# Patient Record
Sex: Male | Born: 1995 | Race: White | Hispanic: No | Marital: Single | Smoking: Current some day smoker
Health system: Southern US, Community
[De-identification: ages and names within clinical notes are randomized; demographics above are authoritative.]

## PROBLEM LIST (undated history)

## (undated) DIAGNOSIS — R635 Abnormal weight gain: Secondary | ICD-10-CM

## (undated) DIAGNOSIS — T50905A Adverse effect of unspecified drugs, medicaments and biological substances, initial encounter: Secondary | ICD-10-CM

## (undated) DIAGNOSIS — E669 Obesity, unspecified: Secondary | ICD-10-CM

## (undated) DIAGNOSIS — Z7289 Other problems related to lifestyle: Secondary | ICD-10-CM

## (undated) DIAGNOSIS — J310 Chronic rhinitis: Secondary | ICD-10-CM

## (undated) DIAGNOSIS — F909 Attention-deficit hyperactivity disorder, unspecified type: Secondary | ICD-10-CM

## (undated) DIAGNOSIS — F329 Major depressive disorder, single episode, unspecified: Secondary | ICD-10-CM

## (undated) DIAGNOSIS — F32A Depression, unspecified: Secondary | ICD-10-CM

## (undated) DIAGNOSIS — T7840XA Allergy, unspecified, initial encounter: Secondary | ICD-10-CM

## (undated) DIAGNOSIS — F419 Anxiety disorder, unspecified: Secondary | ICD-10-CM

## (undated) HISTORY — DX: Chronic rhinitis: J31.0

## (undated) HISTORY — DX: Allergy, unspecified, initial encounter: T78.40XA

## (undated) HISTORY — DX: Adverse effect of unspecified drugs, medicaments and biological substances, initial encounter: T50.905A

## (undated) HISTORY — PX: NO PAST SURGERIES: SHX2092

## (undated) HISTORY — DX: Obesity, unspecified: E66.9

## (undated) HISTORY — DX: Attention-deficit hyperactivity disorder, unspecified type: F90.9

## (undated) HISTORY — DX: Abnormal weight gain: R63.5

## (undated) HISTORY — DX: Other problems related to lifestyle: Z72.89

---
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
## 2017-01-01 DIAGNOSIS — R739 Hyperglycemia, unspecified: Secondary | ICD-10-CM | POA: Diagnosis not present

## 2017-01-05 DIAGNOSIS — F5104 Psychophysiologic insomnia: Secondary | ICD-10-CM | POA: Diagnosis not present

## 2017-01-05 DIAGNOSIS — F329 Major depressive disorder, single episode, unspecified: Secondary | ICD-10-CM | POA: Diagnosis not present

## 2017-01-05 DIAGNOSIS — R400 Somnolence: Secondary | ICD-10-CM | POA: Diagnosis not present

## 2017-01-05 DIAGNOSIS — F411 Generalized anxiety disorder: Secondary | ICD-10-CM | POA: Diagnosis not present

## 2017-01-09 DIAGNOSIS — F5104 Psychophysiologic insomnia: Secondary | ICD-10-CM | POA: Diagnosis not present

## 2017-01-09 DIAGNOSIS — G4719 Other hypersomnia: Secondary | ICD-10-CM | POA: Diagnosis not present

## 2017-09-03 ENCOUNTER — Telehealth: Payer: Self-pay

## 2017-09-03 NOTE — Telephone Encounter (Signed)
FYI

## 2017-09-03 NOTE — Telephone Encounter (Signed)
Copied from CRM [id number]. Topic: Appointment Scheduling - New Patient >> Sep 02, 2017  1:55 PM Arlyss Gandyichardson, Taren N, NT wrote: New patient has been scheduled for your office. Provider: Dr. Carmelia RollerWendling Date of Appointment: 09/16/17  Route to department's PEC pool.

## 2017-09-16 ENCOUNTER — Encounter: Payer: Self-pay | Admitting: Family Medicine

## 2017-09-16 ENCOUNTER — Ambulatory Visit: Payer: Self-pay | Admitting: Family Medicine

## 2017-09-16 ENCOUNTER — Ambulatory Visit (INDEPENDENT_AMBULATORY_CARE_PROVIDER_SITE_OTHER): Payer: BLUE CROSS/BLUE SHIELD | Admitting: Family Medicine

## 2017-09-16 VITALS — BP 118/78 | HR 80 | Temp 98.3°F | Ht 68.0 in | Wt 188.0 lb

## 2017-09-16 DIAGNOSIS — F902 Attention-deficit hyperactivity disorder, combined type: Secondary | ICD-10-CM

## 2017-09-16 DIAGNOSIS — F418 Other specified anxiety disorders: Secondary | ICD-10-CM | POA: Diagnosis not present

## 2017-09-16 MED ORDER — FLUOXETINE HCL 20 MG PO TABS: 20.0000 mg | ORAL_TABLET | Freq: Every day | ORAL | 3 refills | Status: DC

## 2017-09-16 MED ORDER — ATOMOXETINE HCL 40 MG PO CAPS: 40.0000 mg | ORAL_CAPSULE | Freq: Every day | ORAL | 2 refills | Status: DC

## 2017-09-16 NOTE — Progress Notes (Signed)
Pre visit review using our clinic review tool, if applicable. No additional management support is needed unless otherwise documented below in the visit note. 

## 2017-09-16 NOTE — Progress Notes (Signed)
Chief Complaint  Patient presents with  . New Patient (Initial Visit)       New Patient Visit SUBJECTIVE: HPI: Nathaniel OpitzMichael R Van Herpen is an 22 y.o.male who is being seen for establishing care.  Hx of ADHD, dx'd with he was 8 by pediatrician.  He has been off of his medication for the past 3 years.  He has been having issues with anxiety, depression, and tension.  Patient wishes to go back on medication.  His most recent successful medication with Strattera.  He does not remember what he used to be on for anxiety/depression.  He does have a family history of mental illness with grandmother and father.  No homicidal or suicidal ideations.  Due to his inattention, he has been having difficulties maintaining steady work.  He does not currently follow with a counselor or psychologist.  No self-medication.  Allergies  Allergen Reactions  . Cinnamon Other (See Comments)    Throat itching    Past Medical History:  Diagnosis Date  . ADHD (attention deficit hyperactivity disorder) 04/14/2011  . Allergy   . Chronic rhinitis    acute sinusitis about once a year  . Depression   . Obesity    Wt going up on psych meds  . 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 Weight gain due to medication 05/22/2011   History reviewed. No pertinent surgical history. Family History  Problem Relation Age of Onset  . Hypertension Mother   . Anemia Mother   . Iron deficiency Mother   . Diabetes Father   . ADD / ADHD Brother    Allergies  Allergen Reactions  . Cinnamon Other (See Comments)    Throat itching   Came into appointment taking no medications routinely  ROS Cardiovascular: Denies palpitations  Psych: As noted in HPI   OBJECTIVE: BP 118/78 (BP Location: Left Arm, Patient Position: Sitting, Cuff Size: Normal)   Pulse 80   Temp 98.3 F (36.8 C) (Oral)   Ht 5\' 8"  (1.727 m)   Wt 188 lb (85.3 kg)   SpO2 96%   BMI 28.59 kg/m   Constitutional: -  VS reviewed -   Well developed, well nourished, appears stated age -  No apparent distress  Psychiatric: -  Oriented to person, place, and time -  Memory intact -  Affect and mood normal -  Fluent conversation, good eye contact -  Judgment and insight age appropriate  Eye: -  Conjunctivae clear, no discharge -  Pupils symmetric, round, reactive to light  ENMT: -  MMM    Pharynx moist, no exudate, no erythema  Neck: -  No gross swelling, no palpable masses -  Thyroid midline, not enlarged, mobile, no palpable masses  Cardiovascular: -  RRR -  No LE edema  Respiratory: -  Normal respiratory effort, no accessory muscle use, no retraction -  Breath sounds equal, no wheezes, no ronchi, no crackles  Neurological:  -  CN II - XII grossly intact -DTRs equal and symmetric, no clonus  Skin: -  No significant lesion on inspection -  Warm and dry to palpation   ASSESSMENT/PLAN: Anxiety with depression - Plan: FLUoxetine (PROZAC) 20 MG tablet  Attention deficit hyperactivity disorder (ADHD), combined type - Plan: atomoxetine (STRATTERA) 40 MG capsule  Patient instructed to sign release of records form from his previous PCP. Restart Strattera, start Prozac, half tab daily for 2 weeks. Number for counseling given. Patient should return in 6  weeks. The patient voiced understanding and agreement to the plan.   Jilda Roche [place], DO 09/16/17  12:03 PM

## 2017-09-16 NOTE — Patient Instructions (Signed)
Please consider counseling. Contact [phone] to schedule an appointment or inquire about cost/insurance coverage.  Coping skills Choose 5 that work for you:  Take a deep breath  Count to 20  Read a book  Do a puzzle  Meditate  Bake  Sing  Knit  Garden  Pray  Go outside  Call a friend  Listen to music  Take a walk  Color  Send a note  Take a bath  Watch a movie  Be alone in a quiet place  Pet an animal  Visit a friend  Journal  Exercise  Stretch   Let us know if you need anything.  

## 2017-09-28 ENCOUNTER — Telehealth: Payer: Self-pay | Admitting: Family Medicine

## 2017-09-28 DIAGNOSIS — F418 Other specified anxiety disorders: Secondary | ICD-10-CM

## 2017-09-28 NOTE — Telephone Encounter (Signed)
Copied from CRM [id number]. Topic: General - Other >> Sep 28, 2017  1:31 PM Mcneil, Ja-Kwan wrote: Reason for CRM: Pt states he would need an alternative for both prescriptions because the cost was too expensive. Pt requests a call back. Cb# [phone]

## 2017-09-28 NOTE — Telephone Encounter (Signed)
Called left message to call back 

## 2017-09-28 NOTE — Telephone Encounter (Signed)
Please see if Prozac capsules are covered as well as generic atomoxetine. TY.

## 2017-09-28 NOTE — Telephone Encounter (Signed)
Copied from CRM [phone]. Topic: Quick Communication - Office Called Patient >> Sep 28, 2017  5:38 PM Stephannie LiSimmons, Decoda Van L, NT wrote: Reason for CRM: Patient returned call from office would like a call back

## 2017-09-29 NOTE — Telephone Encounter (Signed)
[facility] Any word on the Atomoxetine? [facility] is an alternative to that and [facility] TY.

## 2017-09-29 NOTE — Telephone Encounter (Signed)
Spoke to the patient and he is ok to send both into the [facility] [place]  prozac capsules

## 2017-09-29 NOTE — Telephone Encounter (Signed)
Called left message to call back 

## 2017-09-29 NOTE — Telephone Encounter (Signed)
The prozac capsules are not covered. Please advise.

## 2017-09-29 NOTE — Telephone Encounter (Signed)
See telephone note already created.

## 2017-09-30 MED ORDER — BUPROPION HCL ER (XL) 150 MG PO TB24: 150.0000 mg | ORAL_TABLET | Freq: Every day | ORAL | 2 refills | Status: DC

## 2017-09-30 MED ORDER — FLUOXETINE HCL 20 MG PO CAPS: 20.0000 mg | ORAL_CAPSULE | Freq: Every day | ORAL | 3 refills | Status: DC

## 2017-09-30 NOTE — Addendum Note (Signed)
Addended by: Radene GunningWENDLING, NICHOLAS P on: 09/30/2017 08:40 AM   Modules accepted: Orders

## 2017-10-28 ENCOUNTER — Encounter: Payer: Self-pay | Admitting: Family Medicine

## 2017-10-28 ENCOUNTER — Ambulatory Visit (INDEPENDENT_AMBULATORY_CARE_PROVIDER_SITE_OTHER): Payer: BLUE CROSS/BLUE SHIELD | Admitting: Family Medicine

## 2017-10-28 VITALS — BP 132/84 | HR 81 | Temp 98.2°F | Ht 68.0 in | Wt 192.5 lb

## 2017-10-28 DIAGNOSIS — F418 Other specified anxiety disorders: Secondary | ICD-10-CM | POA: Diagnosis not present

## 2017-10-28 DIAGNOSIS — E663 Overweight: Secondary | ICD-10-CM

## 2017-10-28 NOTE — Patient Instructions (Signed)
Go back on Flonase.  Try taking your meds in the morning.   Aim to do some physical exertion for 150 minutes per week. This is typically divided into 5 days per week, 30 minutes per day. The activity should be enough to get your heart rate up. Anything is better than nothing if you have time constraints.  Let us know if you need anything.  Healthy Eating Plan Many factors influence your heart health, including eating and exercise habits. Heart (coronary) risk increases with abnormal blood fat (lipid) levels. Heart-healthy meal planning includes limiting unhealthy fats, increasing healthy fats, and making other small dietary changes. This includes maintaining a healthy body weight to help keep lipid levels within a normal range.  WHAT IS MY PLAN?  Your health care provider recommends that you:  Drink a glass of water before meals to help with satiety.  Eat slowly.  An alternative to the water is to add Metamucil. This will help with satiety as well. It does contain calories, unlike water.  WHAT TYPES OF FAT SHOULD I CHOOSE?  Choose healthy fats more often. Choose monounsaturated and polyunsaturated fats, such as olive oil and canola oil, flaxseeds, walnuts, almonds, and seeds.  Eat more omega-3 fats. Good choices include salmon, mackerel, sardines, tuna, flaxseed oil, and ground flaxseeds. Aim to eat fish at least two times each week.  Avoid foods with partially hydrogenated oils in them. These contain trans fats. Examples of foods that contain trans fats are stick margarine, some tub margarines, cookies, crackers, and other baked goods. If you are going to avoid a fat, this is the one to avoid!  WHAT GENERAL GUIDELINES DO I NEED TO FOLLOW?  Check food labels carefully to identify foods with trans fats. Avoid these types of options when possible.  Fill one half of your plate with vegetables and green salads. Eat 4-5 servings of vegetables per day. A serving of vegetables equals 1 cup  of raw leafy vegetables,  cup of raw or cooked cut-up vegetables, or  cup of vegetable juice.  Fill one fourth of your plate with whole grains. Look for the word "whole" as the first word in the ingredient list.  Fill one fourth of your plate with lean protein foods.  Eat 4-5 servings of fruit per day. A serving of fruit equals one medium whole fruit,  cup of dried fruit,  cup of fresh, frozen, or canned fruit. Try to avoid fruits in cups/syrups as the sugar content can be high.  Eat more foods that contain soluble fiber. Examples of foods that contain this type of fiber are apples, broccoli, carrots, beans, peas, and barley. Aim to get 20-30 g of fiber per day.  Eat more home-cooked food and less restaurant, buffet, and fast food.  Limit or avoid alcohol.  Limit foods that are high in starch and sugar.  Avoid fried foods when able.  Cook foods by using methods other than frying. Baking, boiling, grilling, and broiling are all great options. Other fat-reducing suggestions include: ? Removing the skin from poultry. ? Removing all visible fats from meats. ? Skimming the fat off of stews, soups, and gravies before serving them. ? Steaming vegetables in water or broth.  Lose weight if you are overweight. Losing just 5-10% of your initial body weight can help your overall health and prevent diseases such as diabetes and heart disease.  Increase your consumption of nuts, legumes, and seeds to 4-5 servings per week. One serving of dried beans or legumes  equals  cup after being cooked, one serving of nuts equals 1 ounces, and one serving of seeds equals  ounce or 1 tablespoon.  WHAT ARE GOOD FOODS CAN I EAT? Grains Grainy breads (try to find bread that is 3 g of fiber per slice or greater), oatmeal, light popcorn. Whole-grain cereals. Rice and pasta, including brown rice and those that are made with whole wheat. Edamame pasta is a great alternative to grain pasta. It has a higher protein  content. Try to avoid significant consumption of white bread, sugary cereals, or pastries/baked goods.  Vegetables All vegetables. Cooked white potatoes do not count as vegetables.  Fruits All fruits, but limit pineapple and bananas as these fruits have a higher sugar content.  Meats and Other Protein Sources Lean, well-trimmed beef, veal, pork, and lamb. Chicken and [place] without skin. All fish and shellfish. Wild duck, rabbit, pheasant, and venison. Egg whites or low-cholesterol egg substitutes. Dried beans, peas, lentils, and tofu.Seeds and most nuts.  Dairy Low-fat or nonfat cheeses, including ricotta, string, and mozzarella. Skim or 1% milk that is liquid, powdered, or evaporated. Buttermilk that is made with low-fat milk. Nonfat or low-fat yogurt. Soy/Almond milk are good alternatives if you cannot handle dairy.  Beverages Water is the best for you. Sports drinks with less sugar are more desirable unless you are a highly active athlete.  Sweets and Desserts Sherbets and fruit ices. Honey, jam, marmalade, jelly, and syrups. Dark chocolate.  Eat all sweets and desserts in moderation.  Fats and Oils Nonhydrogenated (trans-free) margarines. Vegetable oils, including soybean, sesame, sunflower, olive, peanut, safflower, corn, canola, and cottonseed. Salad dressings or mayonnaise that are made with a vegetable oil. Limit added fats and oils that you use for cooking, baking, salads, and as spreads.  Other Cocoa powder. Coffee and tea. Most condiments.  The items listed above may not be a complete list of recommended foods or beverages. Contact your dietitian for more options.

## 2017-10-28 NOTE — Progress Notes (Signed)
Pre visit review using our clinic review tool, if applicable. No additional management support is needed unless otherwise documented below in the visit note. 

## 2017-10-28 NOTE — Progress Notes (Signed)
Chief Complaint  Patient presents with  . Follow-up    Subjective Nathaniel Jacobson presents for f/u anxiety/depression.  He is currently being treated with Prozac and Wellbutrin.  Reports improvement since treatment. No thoughts of harming self or others. No self-medication with alcohol, prescription drugs or illicit drugs. Not following with counselor or psychologist.  Has been gaining weight.  Diet is fair.  He is active at work.  ROS Psych: No homicidal or suicidal thoughts  Past Medical History:  Diagnosis Date  . ADHD (attention deficit hyperactivity disorder) 04/14/2011  . Allergy   . Chronic rhinitis    acute sinusitis about once a year  . Depression   . Obesity    Wt going up on psych meds  . 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 Weight gain due to medication 05/22/2011   Exam BP 132/84 (BP Location: Left Arm, Patient Position: Sitting, Cuff Size: Normal)   Pulse 81   Temp 98.2 F (36.8 C) (Oral)   Ht 5\' 8"  (1.727 m)   Wt 192 lb 8 oz (87.3 kg)   SpO2 96%   BMI 29.27 kg/m  General:  well developed, well nourished, in no apparent distress Neck: neck supple without adenopathy, thyromegaly, or masses Lungs:  clear to auscultation, breath sounds equal bilaterally, no respiratory distress Cardio:  regular rate and rhythm without murmurs, heart sounds without clicks or rubs Psych: well oriented with normal range of affect and age-appropriate judgement/insight, alert and oriented x4.  Assessment and Plan  Anxiety with depression  Overweight (BMI 25.0-29.9)  Continue Prozac and Wellbutrin. Try to take medicine before work. Counseled on diet and exercise.  Healthy diet handout given. F/u in 3 mo. The patient voiced understanding and agreement to the plan.  Jilda Rocheicholas Paul RudyardWendling, DO 10/28/17 10:58 AM

## 2018-01-28 ENCOUNTER — Ambulatory Visit: Payer: BLUE CROSS/BLUE SHIELD | Admitting: Family Medicine

## 2018-01-28 DIAGNOSIS — Z0289 Encounter for other administrative examinations: Secondary | ICD-10-CM

## 2018-02-04 ENCOUNTER — Encounter: Payer: Self-pay | Admitting: Family Medicine

## 2018-07-15 ENCOUNTER — Other Ambulatory Visit: Payer: Self-pay

## 2018-07-15 ENCOUNTER — Ambulatory Visit (INDEPENDENT_AMBULATORY_CARE_PROVIDER_SITE_OTHER): Payer: BLUE CROSS/BLUE SHIELD | Admitting: Podiatry

## 2018-07-15 ENCOUNTER — Encounter: Payer: Self-pay | Admitting: Podiatry

## 2018-07-15 VITALS — Temp 97.5°F

## 2018-07-15 DIAGNOSIS — L600 Ingrowing nail: Secondary | ICD-10-CM

## 2018-07-15 DIAGNOSIS — M79676 Pain in unspecified toe(s): Secondary | ICD-10-CM | POA: Diagnosis not present

## 2018-07-15 MED ORDER — NEOMYCIN-POLYMYXIN-HC 3.5-10000-1 OT SOLN: OTIC | 0 refills | Status: DC

## 2018-07-15 NOTE — Patient Instructions (Signed)
Epsom Salt Soak Instructions    IF SOAKING IS STILL NECESSARY, START THIS 2 WEEKS AFTER INITIAL PROCEDURE   Place 1/4 cup of epsom salts in a quart of warm tap water.  Soak your foot or feet in the solution for 20 minutes twice a day until you notice the area has dried and a scab has formed. Continue to apply other medications to the area as directed by the doctor such as polysporin, neosporin or cortisporin drops.  IF YOUR SKIN BECOMES IRRITATED WHILE USING THESE INSTRUCTIONS, IT IS OKAY TO SWITCH TO  WHITE VINEGAR AND WATER. Or you may use antibacterial soap and water to keep the toe clean  Monitor for any signs/symptoms of infection. Call the office immediately if any occur or go directly to the emergency room. Call with any questions/concerns.  Long Term Care Instructions-Post Nail Surgery  You have had your ingrown toenail and root treated with a chemical.  This chemical causes a burn that will drain and ooze like a blister.  This can drain for 6-8 weeks or longer.  It is important to keep this area clean, covered, and follow the soaking instructions dispensed at the time of your surgery.  This area will eventually dry and form a scab.  Once the scab forms you no longer need to soak or apply a dressing.  If at any time you experience an increase in pain, redness, swelling, or drainage, you should contact the office as soon as possible. 

## 2018-07-15 NOTE — Progress Notes (Signed)
Subjective:  Patient ID: Nathaniel Jacobson, male    DOB: 09/15/1995,  MRN: [id number]  Chief Complaint  Patient presents with  . Ingrown Toenail    Rt hallux nail" my toenail has been hurting for awhile, I have tried soaking it but it still hurts"    23 y.o. male presents with the above complaint. History as above. Says that the tunnel is hurting for a while has a history of ingrown toenails to both feet.  Review of Systems: Negative except as noted in the HPI. Denies N/V/F/Ch.  Past Medical History:  Diagnosis Date  . ADHD (attention deficit hyperactivity disorder) 04/14/2011  . Allergy   . Chronic rhinitis    acute sinusitis about once a year  . Depression   . Obesity    Wt going up on psych meds  . 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 Weight gain due to medication 05/22/2011    Current Outpatient Medications:  .  traZODone (DESYREL) 50 MG tablet, Take 1-2 tablets nightly at bedtime., Disp: , Rfl:  .  buPROPion (WELLBUTRIN XL) 150 MG 24 hr tablet, Take 1 tablet (150 mg total) by mouth daily., Disp: 30 tablet, Rfl: 2 .  FLUoxetine (PROZAC) 20 MG capsule, Take 1 capsule (20 mg total) by mouth daily., Disp: 30 capsule, Rfl: 3 .  neomycin-polymyxin-hydrocortisone (CORTISPORIN) OTIC solution, Apply 2 drops to the ingrown toenail site twice daily. Cover with band-aid., Disp: 10 mL, Rfl: 0  Social History   Tobacco Use  Smoking Status Never Smoker  Smokeless Tobacco Never Used    Allergies  Allergen Reactions  . Cinnamon Other (See Comments)    Throat itching   Objective:  Vitals:   07/15/18 1421  Temp: (!) 97.5 F (36.4 C)   There is no height or weight on file to calculate BMI. Constitutional Well developed. Well nourished.  Vascular Dorsalis pedis pulses palpable bilaterally. Posterior tibial pulses palpable bilaterally. Capillary refill normal to all digits.  No cyanosis or clubbing noted. Pedal hair growth normal.  Neurologic  Normal speech. Oriented to person, place, and time. Epicritic sensation to light touch grossly present bilaterally.  Dermatologic Painful ingrowing nail at lateral nail borders of the hallux nail right. No other open wounds. No skin lesions.  Orthopedic: Normal joint ROM without pain or crepitus bilaterally. No visible deformities. No bony tenderness.   Radiographs: None Assessment:   1. Ingrown nail   2. Pain around toenail    Plan:  Patient was evaluated and treated and all questions answered.  Ingrown Nail, right -Patient elects to proceed with minor surgery to remove ingrown toenail removal today. Consent reviewed and signed by patient. -Ingrown nail excised. See procedure note. -Educated on post-procedure care including soaking. Written instructions provided and reviewed. -Patient to follow up in 2 weeks for nail check.  Procedure: Excision of Ingrown Toenail Location: Right 1st toe lateral nail borders. Anesthesia: Lidocaine 1% plain; 1.5 mL and Marcaine 0.5% plain; 1.5 mL, digital block. Skin Prep: Betadine. Dressing: Silvadene; telfa; dry, sterile, compression dressing. Technique: Following skin prep, the toe was exsanguinated and a tourniquet was secured at the base of the toe. The affected nail border was freed, split with a nail splitter, and excised. Chemical matrixectomy was then performed with phenol and irrigated out with alcohol. The tourniquet was then removed and sterile dressing applied. Disposition: Patient tolerated procedure well. Patient to return in 2 weeks for follow-up.   Return in about 2 weeks (  around 07/29/2018) for Nail Check.

## 2018-07-29 ENCOUNTER — Encounter: Payer: Self-pay | Admitting: Podiatry

## 2018-07-29 ENCOUNTER — Ambulatory Visit (INDEPENDENT_AMBULATORY_CARE_PROVIDER_SITE_OTHER): Payer: BLUE CROSS/BLUE SHIELD | Admitting: Podiatry

## 2018-07-29 ENCOUNTER — Other Ambulatory Visit: Payer: Self-pay

## 2018-07-29 VITALS — Temp 98.4°F

## 2018-07-29 DIAGNOSIS — M7989 Other specified soft tissue disorders: Secondary | ICD-10-CM

## 2018-07-29 DIAGNOSIS — M79674 Pain in right toe(s): Secondary | ICD-10-CM | POA: Diagnosis not present

## 2018-07-29 DIAGNOSIS — M79676 Pain in unspecified toe(s): Secondary | ICD-10-CM

## 2018-07-29 DIAGNOSIS — L600 Ingrowing nail: Secondary | ICD-10-CM

## 2018-07-29 NOTE — Progress Notes (Signed)
  Subjective:  Patient ID: Nathaniel Jacobson, male    DOB: January 03, 1996,  MRN: [id number]  Chief Complaint  Patient presents with  . Nail Problem    R-great toe ingrown follow up; "doing alright; still red with little scabbing; concerned about other big toe now on L; starting to bother me more on medial side"   23 y.o. male returns for the above complaint.  History above confirmed with patient  Objective:   General AA&O x3. Normal mood and affect.  Vascular Foot warm and well perfused with good capillary refill.  Neurologic Sensation grossly intact.  Dermatologic Right lateral border healing well with erythema but no warmth no drainage no signs of acute infection Painful ingrown toenail medial border left great toe  Orthopedic: No tenderness to palpation of the toe.  Pain to palpation about the left great toe   Assessment & Plan:  Patient was evaluated and treated and all questions answered.  S/p Ingrown Toenail Excision, right -Healing well without issue. -Discussed return precautions. -F/u PRN  Ingrown toenail left great toe medial border -Debrided in slant back fashion to patient relief

## 2018-08-16 ENCOUNTER — Ambulatory Visit (INDEPENDENT_AMBULATORY_CARE_PROVIDER_SITE_OTHER): Payer: BLUE CROSS/BLUE SHIELD | Admitting: Podiatry

## 2018-08-16 ENCOUNTER — Other Ambulatory Visit: Payer: Self-pay

## 2018-08-16 ENCOUNTER — Encounter: Payer: Self-pay | Admitting: Podiatry

## 2018-08-16 VITALS — Temp 97.3°F

## 2018-08-16 DIAGNOSIS — L600 Ingrowing nail: Secondary | ICD-10-CM | POA: Diagnosis not present

## 2018-08-16 MED ORDER — NEOMYCIN-POLYMYXIN-HC 3.5-10000-1 OT SOLN: OTIC | 0 refills | Status: DC

## 2018-08-16 NOTE — Patient Instructions (Signed)

## 2018-08-18 NOTE — Progress Notes (Signed)
Subjective:   Patient ID: Nathaniel Jacobson, male   DOB: 23 y.o.   MRN: [id number]   HPI Patient presents with painful ingrown toenail deformity left hallux stating that it is hard for him to wear shoe gear comfortably   ROS      Objective:  Physical Exam  Neurovascular status intact with patient's left hallux showing incurvated nail bed that is painful when pressed and making shoe gear difficult     Assessment:  Ingrown toenail deformity left hallux medial border     Plan:  H&P condition reviewed and recommended nail border removal.  Explained procedure risk and patient wants procedure understanding risk and signed consent form and today I infiltrated the left hallux 60 mg like Marcaine mixt with sterile instrumentation remove the medial border and exposed matrix applied phenol 3 applications 30 seconds followed alcohol by sterile dressing gave instructions for soaks and reappoint to recheck again and encouraged to leave dressing on 24 hours but to take it off earlier if any throbbing were to occur.  Patient was also given a prescription for drops

## 2018-11-08 DIAGNOSIS — S6991XA Unspecified injury of right wrist, hand and finger(s), initial encounter: Secondary | ICD-10-CM | POA: Insufficient documentation

## 2019-06-18 ENCOUNTER — Emergency Department (HOSPITAL_COMMUNITY)
Admission: EM | Admit: 2019-06-18 | Discharge: 2019-06-18 | Disposition: A | Discharge status: Home / Self Care | Payer: BC Managed Care – PPO | Attending: Emergency Medicine | Admitting: Emergency Medicine

## 2019-06-18 ENCOUNTER — Emergency Department (HOSPITAL_COMMUNITY): Payer: BC Managed Care – PPO

## 2019-06-18 DIAGNOSIS — S93501A Unspecified sprain of right great toe, initial encounter: Secondary | ICD-10-CM | POA: Diagnosis not present

## 2019-06-18 DIAGNOSIS — S99921A Unspecified injury of right foot, initial encounter: Secondary | ICD-10-CM | POA: Diagnosis not present

## 2019-06-18 DIAGNOSIS — Y92410 Unspecified street and highway as the place of occurrence of the external cause: Secondary | ICD-10-CM | POA: Diagnosis not present

## 2019-06-18 DIAGNOSIS — T310 Burns involving less than 10% of body surface: Secondary | ICD-10-CM | POA: Diagnosis not present

## 2019-06-18 DIAGNOSIS — Y9389 Activity, other specified: Secondary | ICD-10-CM | POA: Diagnosis not present

## 2019-06-18 DIAGNOSIS — Z79899 Other long term (current) drug therapy: Secondary | ICD-10-CM | POA: Diagnosis not present

## 2019-06-18 DIAGNOSIS — T22211A Burn of second degree of right forearm, initial encounter: Secondary | ICD-10-CM | POA: Insufficient documentation

## 2019-06-18 DIAGNOSIS — R030 Elevated blood-pressure reading, without diagnosis of hypertension: Secondary | ICD-10-CM | POA: Diagnosis not present

## 2019-06-18 DIAGNOSIS — W2210XA Striking against or struck by unspecified automobile airbag, initial encounter: Secondary | ICD-10-CM | POA: Insufficient documentation

## 2019-06-18 DIAGNOSIS — Y999 Unspecified external cause status: Secondary | ICD-10-CM | POA: Insufficient documentation
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
## 2019-09-23 ENCOUNTER — Ambulatory Visit (INDEPENDENT_AMBULATORY_CARE_PROVIDER_SITE_OTHER): Payer: 59 | Admitting: Podiatry

## 2019-09-23 ENCOUNTER — Encounter: Payer: Self-pay | Admitting: Podiatry

## 2019-09-23 ENCOUNTER — Other Ambulatory Visit: Payer: Self-pay

## 2019-09-23 DIAGNOSIS — L600 Ingrowing nail: Secondary | ICD-10-CM

## 2019-09-23 DIAGNOSIS — M79675 Pain in left toe(s): Secondary | ICD-10-CM | POA: Diagnosis not present

## 2019-09-23 MED ORDER — CEPHALEXIN 500 MG PO CAPS: 500.0000 mg | ORAL_CAPSULE | Freq: Three times a day (TID) | ORAL | 0 refills | Status: DC

## 2019-09-23 NOTE — Patient Instructions (Signed)

## 2019-09-25 DIAGNOSIS — L600 Ingrowing nail: Secondary | ICD-10-CM | POA: Insufficient documentation

## 2019-09-25 NOTE — Progress Notes (Signed)
Subjective:   Patient ID: Nathaniel Jacobson, male   DOB: 24 y.o.   MRN: [id number]   HPI 24 year old male presents the office with concerns of ingrown toenail left big toe, medial aspect.  He previously had a procedure performed by Dr. Charlsie Merles.  States the nail started to come back.  The area is tender with pressure in shoes.  Denies any drainage or pus.  Is been getting worse the last 2 weeks.  No other concerns today.   Review of Systems  All other systems reviewed and are negative.  Past Medical History:  Diagnosis Date  . ADHD (attention deficit hyperactivity disorder) 04/14/2011  . Allergy   . Chronic rhinitis    acute sinusitis about once a year  . Depression   . Obesity    Wt going up on psych meds  . 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 Weight gain due to medication 05/22/2011    History reviewed. No pertinent surgical history.   Current Outpatient Medications:  .  buPROPion (WELLBUTRIN XL) 150 MG 24 hr tablet, Take 1 tablet (150 mg total) by mouth daily., Disp: 30 tablet, Rfl: 2 .  cephALEXin (KEFLEX) 500 MG capsule, Take 1 capsule (500 mg total) by mouth 3 (three) times daily., Disp: 21 capsule, Rfl: 0 .  FLUoxetine (PROZAC) 20 MG capsule, Take 1 capsule (20 mg total) by mouth daily., Disp: 30 capsule, Rfl: 3 .  traZODone (DESYREL) 50 MG tablet, Take 1-2 tablets nightly at bedtime., Disp: , Rfl:   Allergies  Allergen Reactions  . Cinnamon Other (See Comments)    Throat itching         Objective:  Physical Exam  General: AAO x3, NAD  Dermatological: Incurvation present to medial aspect of left hallux toenail with localized edema and erythema but there is no drainage or pus there is no ascending cellulitis.  There is no fluctuation or crepitation.  There is no malodor.  Vascular: Dorsalis Pedis artery and Posterior Tibial artery pedal pulses are 2/4 bilateral with immedate capillary fill time. There is no pain with calf  compression, swelling, warmth, erythema.   Neruologic: Grossly intact via light touch bilateral.   Musculoskeletal:  Muscular strength 5/5 in all groups tested bilateral.  Gait: Unassisted, Nonantalgic.       Assessment:   Ingrown toenail left medial hallux     Plan:  -Treatment options discussed including all alternatives, risks, and complications -Etiology of symptoms were discussed -At this time, the patient is requesting partial nail removal with chemical matricectomy to the symptomatic portion of the nail. Risks and complications were discussed with the patient for which they understand and written consent was obtained. Under sterile conditions a total of 3 mL of a mixture of 2% lidocaine plain and 0.5% Marcaine plain was infiltrated in a hallux block fashion. Once anesthetized, the skin was prepped in sterile fashion. A tourniquet was then applied. Next the medial aspect of hallux nail border was then sharply excised making sure to remove the entire offending nail border. Once the nails were ensured to be removed area was debrided and the underlying skin was intact. There is no purulence identified in the procedure. Next phenol was then applied under standard conditions and copiously irrigated. Silvadene was applied. A dry sterile dressing was applied. After application of the dressing the tourniquet was removed and there is found to be an immediate capillary refill time to the digit. The patient tolerated the procedure  well any complications. Post procedure instructions were discussed the patient for which he verbally understood. Follow-up in one week for nail check or sooner if any problems are to arise. Discussed signs/symptoms of infection and directed to call the office immediately should any occur or go directly to the emergency room. In the meantime, encouraged to call the office with any questions, concerns, changes symptoms. -Keflex  Return for nail check-left big  toenail.  Trula Slade DPM

## 2019-11-18 ENCOUNTER — Ambulatory Visit: Payer: 59 | Admitting: Podiatry
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
## 2022-07-18 ENCOUNTER — Ambulatory Visit
Admission: EM | Admit: 2022-07-18 | Discharge: 2022-07-18 | Disposition: A | Discharge status: Home / Self Care | Payer: BC Managed Care – PPO | Attending: Internal Medicine | Admitting: Internal Medicine

## 2022-07-18 DIAGNOSIS — S61101A Unspecified open wound of right thumb with damage to nail, initial encounter: Secondary | ICD-10-CM

## 2022-07-18 MED ORDER — IBUPROFEN 800 MG PO TABS: 800.0000 mg | ORAL_TABLET | Freq: Three times a day (TID) | ORAL | 0 refills | Status: DC | PRN
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
## 2022-07-18 NOTE — Discharge Instructions (Addendum)
You have been prescribed Ibuprofen  for the pain in your thumb. This is an antiinflammatory often used to treat pain. Please take as directed.  Do not combine with any other anti-inflammatories such as Motrin, Advil, Aleve.  You can alternate Tylenol if you have no allergy.   Keep your wound clean and as dry as possible. Do not immerse or soak your wound in water. This means no swimming, washing dishes (unless thick rubber gloves are used), baths, or hot tubs until your thumb heals. Leave original bandages on for the first 24 hours. After this time, showering or rinsing is recommended, rather than bathing. After the first 24 hours, remove old bandages and gently cleanse your wound with soap and water.   Return in 2 to 3 days if no improvement. It is very important for you to pay attention to any new symptoms or worsening of your current condition. Please go directly to the Emergency Department immediately should you begin to feel worse in any way or have any of the following symptoms: persistent fevers, increased pain, increased swelling, increased redness, redness outside the demarcation or streaking up your arm.
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
## 2023-07-12 ENCOUNTER — Ambulatory Visit
Admission: RE | Admit: 2023-07-12 | Discharge: 2023-07-12 | Disposition: A | Discharge status: Home / Self Care | Payer: Self-pay | Source: Ambulatory Visit

## 2023-07-12 VITALS — BP 147/71 | HR 94 | Temp 99.0°F | Resp 18

## 2023-07-12 DIAGNOSIS — U071 COVID-19: Secondary | ICD-10-CM

## 2023-07-12 HISTORY — DX: Anxiety disorder, unspecified: F41.9

## 2023-07-12 LAB — POC COVID19/FLU A&B COMBO
Covid Antigen, POC: POSITIVE — AB
Influenza A Antigen, POC: NEGATIVE
Influenza B Antigen, POC: NEGATIVE

## 2023-07-12 MED ORDER — GUAIFENESIN 400 MG PO TABS: ORAL_TABLET | ORAL | 0 refills | Status: AC

## 2023-07-12 MED ORDER — DEXAMETHASONE 6 MG PO TABS: 6.0000 mg | ORAL_TABLET | Freq: Two times a day (BID) | ORAL | 0 refills | Status: AC

## 2023-07-12 MED ORDER — PROMETHAZINE-DM 6.25-15 MG/5ML PO SYRP: 5.0000 mL | ORAL_SOLUTION | Freq: Every evening | ORAL | 0 refills | Status: AC | PRN

## 2023-07-12 NOTE — ED Triage Notes (Signed)
 Pt c/o cough, body aches, and nasal congestion.  Start date: 07/10/2023  Home Interventions: None

## 2023-07-12 NOTE — Discharge Instructions (Addendum)
 Your COVID-19 test is positive.  Because you do not have a history of being immune compromised, you are currently vaccinated for COVID-19, you are under the age of 104, and/or you do not have a risk of severe disease due to COVID-19, antiviral treatment is not indicated.    I have enclosed some information about COVID-19 and how to care for yourself at home as well as when to seek emergency medical attention.  Please remain at home and isolated from others for the next 24 hours.  After that, you can be around other people but will need to wear a mask when you are around other people for 5 more days.    I have provided you with a note to be out of work for 3 days.  If you need this note extended, please feel free to schedule an online appointment to discuss extending your time out of work until you feel completely better.   Please read below to learn more about the medications, dosages and frequencies that I recommend to help alleviate your symptoms and to get you feeling better soon:   Decadron (dexamethasone):  To quickly address the significant inflammation that COVID-19 causes, please begin dexamethasone 1 tablet twice daily for the next 5 days.  This will help with your cough, your body aches, headache, work of breathing and abdominal pain, if you are experiencing any at this time.   Robitussin, Mucinex (guaifenesin): This is a daytime expectorant.  This single symptom reliever helps break up chest congestion and loosen up thick nasal drainage making phlegm and drainage easier to cough up and to blow out from your nose.  I recommend taking 400 mg in either liquid or tablet form three times daily as needed.  I do not recommend the 12-hour extended relief version or doses higher than 400 mg per each dose as these often make some patients feel jittery or jumpy and can interfere with sleep.  I also do not recommend that you purchase guaifenesin with the ingredient " DM" which is dextromethorphan, a cough  suppressant which I only recommend taking at bedtime.  Guaifenesin 400 mg is a safe dose for people who are being treated for high blood pressure.     Promethazine DM: Promethazine is both a nasal decongestant that dries up mucous membranes and an antinausea medication.  Promethazine often makes most patients feel fairly sleepy.  "DM" is dextromethorphan, a single symptom reliever which is a cough suppressant found in many over-the-counter cough medications and combination cold preparations.  Please take 5 mL before bedtime to minimize your cough which will help you sleep better.  I have sent a prescription for this medication to your pharmacy because it cannot be purchased over-the-counter.   Conservative care is also recommended with rest, drinking plenty of clear fluids, eating only when hungry, taking supportive medications for your symptoms and avoiding being around other people.  Please remain at home until you are fever free for 24 hours without the use of antifever medications such as Tylenol and ibuprofen.   If symptoms have not meaningfully improved in the next 5 to 7 days, please return for repeat evaluation or follow-up with your regular provider.  If symptoms have worsened in the next 3 to 5 days, please go to the emergency room for further evaluation.    Thank you for visiting urgent care today.  We appreciate the opportunity to participate in your care.    What is COVID-19?   ??V?D-19 stands for "  coronavirus disease 2019." It is caused by a virus called ??RS-?oV-2.   C?V?D-19 mainly spreads from person to person usually when an infected person coughs, sneezes, or talks near other people.  A person can become infected and spread the virus to others, even without having any symptoms.  This one of the reasons COVID-19 spreads so quickly.   Can COVID-19 be prevented?   Yes!  The best way to prevent ??V?D-19 is to get vaccinated. In the [place] , people age 60 months and older can get a  vaccine.  In addition to vaccination, there are other things you can do to help protect yourself and others. You can:  ?Wash your hands often. ?Consider wearing a face mask in some situations. Masks can help protect both the wearer and others around them. ?Stay home when you are sick. Try to avoid close contact with other people. ?Cover your mouth and nose with the inside of your elbow when you cough or sneeze. ?If someone in your home is sick, regularly clean things that are touched a lot. This includes counters, bedside tables, doorknobs, computers, phones, and bathroom surfaces. ?Make sure that there is good ventilation (air flow) in your home. When possible, open windows to let fresh air in.  Experts recommend "layering" these strategies. This means doing more than 1 of the things above to protect yourself, especially at times when lots of people are sick.  What are the symptoms of COVID-19?   Symptoms usually start 3 to 5 days after a person is infected with the virus but for some people, it can take up to 2 weeks for symptoms to appear. Many infected people only have mild cold symptoms. Some people never show symptoms at all.  When symptoms do happen, they can include:  ? Fever ? Cough ? Trouble breathing ? Feeling tired ? Shaking chills ? Muscle aches ? Headache ? Sore throat ? Runny or stuffy nose ? Problems with sense of smell or taste ? Diarrhea and vomiting ? Rash or other skin problems  For most people, symptoms get better within a few days to weeks. But a small number of people get very sick and stop being able to breathe on their own. In severe cases, their organs stop working, which can lead to death.  Some people with C?VID-19 continue to have some symptoms for weeks or months. This seems to be more likely in people who are sick enough to need to stay in the hospital. But this can also happen in people who did not get very sick.  What should I do if I get COVID-19?    Stay home, rest, and drink plenty of fluids. You can also take acetaminophen (sample brand name: Tylenol) to relieve fever and aches. If this does not help, you can try medicines like ibuprofen (sample brand names: Advil, Motrin).  If you go to a walk-in clinic or a hospital because of your symptoms, tell someone right away why you are there. The staff might ask you to wear a mask or to wait someplace where you are less likely to spread your infection.  Whether or not you need to see a medical provider, stay home while you are sick with C?V?D-19. Do not go to work or school until your fever has been gone for at least 24 hours without taking medicine such as acetaminophen.  If your breathing symptoms get worse, call your doctor or nurse for advice. If you think that you are having a medical emergency,  call 911 for an ambulance  If I have COVID-19, do I need special treatment?   It depends on your age, health, and symptoms. Most people with mild C?VID-19 can rest at home until they get better. "Mild" means that you might have symptoms like fever, cough, or other cold symptoms, but you do not have trouble breathing. Treatment is not recommended. It often takes about 2 weeks for symptoms to improve, but it's not the same for everyone.  Doctors do recommend treatment for people who are at risk for getting seriously ill, even if their symptoms are mild. This includes:  ?Adults 65 years or older ?Adults who have certain health conditions - Examples include a weaker than normal immune system, diabetes, serious heart or lung disease, chronic kidney disease, and obesity. ?Adults 50 years or older who have not been vaccinated  If you are not sure if you fit into any of these categories, ask your doctor or nurse about treatment. They can talk to you about the risks and benefits.  How is COVID-19 treated?   The medicine most often used is an "antiviral" called nirmatrelvir-ritonavir (brand name:  Paxlovid). This can lower your risk of getting sicker.  If your doctor suggests this treatment, it's important to know:  ?Paxlovid comes as several pills that you take for 5 days. ?Treatment should be started within 5 days after symptoms begin. This is why it's important to test early so you know if you have C?V?D-19 as soon as possible. ?Before prescribing Paxlovid, your medical provider will review any other medicines and supplements that you take. In some cases, they might want to change or stop your other medicines while you take Paxlovid.  Some people who are treated with Paxlovid get something called "viral rebound." This means that they start testing negative after having C?V?D-19, but then test positive again. Symptoms might also come back, though they are almost always mild. If you are at risk for serious illness, the benefits of treatment are still greater than the risk of viral rebound.  For people who cannot take Paxlovid, there are other options for treatment that you may discuss with your medical provider.  Am I at risk for getting seriously ill?   It depends on your age, your health, and whether you have been vaccinated. In some people, ??V?D-19 leads to serious problems like ???um?ni?, which can cause a person to not get enough oxygen. It can also lead to heart problems, or even death. This risk gets higher as people get older. It is also higher in people who have other health problems like serious heart disease, chronic kidney disease, type 2 diabetes, chronic obstructive pulmonary disease ("COPD"), sickle cell disease, or obesity. People who have a weak immune system for other reasons (for example, HIV infection or certain medicines), asthma, cystic fibrosis, type 1 diabetes, or high blood pressure might also be at higher risk for serious problems.  Getting vaccinated makes people much less likely to get seriously ill with ??V?D-19.  When should I seek medical attention?   Call  your doctor if:  ?You develop new shortness of breath, or your breathing gets worse (but you can still talk in full sentences). ?You become weak or dizzy. ?You have very dark urine or do not urinate for more than 8 hours. ?You have new or worsening symptoms that concern you - ??VID-19 symptoms can include fever, cough, feeling very tired, shaking, chills, headache, and trouble swallowing. They can also include digestive problems like vomiting  or diarrhea.  Call 911 for an ambulance if:  ?You are having so much trouble breathing that you cannot speak a full sentence. ?You are very confused or cannot stay awake. ?Your lips or skin start to turn blue. ?You think that you might be having a medical emergency - Examples include severe chest pain, feeling extremely weak or like you might pass out, or losing control of your body (like being unable to speak normally or move your arm or leg).  Where can I go to learn more?   You can find more information about ?OVID-19 at the following websites: ?[place]  Centers for Disease Control and Prevention ("CDC"): IndexCrawler.co.za ?World Health Organization ("WHO"): [employer].es

## 2023-07-12 NOTE — ED Provider Notes (Signed)
 [facility]    CSN: [id number] Arrival date & time: 07/12/23  0957    HISTORY   Chief Complaint  Patient presents with   Cough   Generalized Body Aches   HPI ASCENCION COYE Herpen is a pleasant, 28 y.o. male who presents to urgent care today. Pt c/o 2-day history of nonproductive cough, body aches, nasal congestion, rhinorrhea, postnasal drip.  Patient states that his boss and family members have had similar symptoms since his began.  Patient states he is not sure whether he has a viral infection or if it is just typical "season change" symptoms.  Patient states he has been taking ibuprofen without meaningful relief.  Denies headache, sore throat, nausea, vomiting, diarrhea.  The history is provided by the patient.  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   pulling out eyebrows and eyelashes   Weight gain due to medication 05/22/2011   Patient Active Problem List   Diagnosis Date Noted   Ingrown nail 09/25/2019   Injury of nail bed of finger of right hand 11/08/2018   Anxiety with depression 09/16/2017   Chronic insomnia 01/05/2017   Finger abrasion 05/08/2015   BMI (body mass index), pediatric, 85% to less than 95% for age 46/21/2015   Allergic rhinitis 02/11/2013   Elevated BP 10/28/2012   Acne vulgaris 09/27/2012   Needs parenting support and education 06/10/2012   School failure 06/09/2012   Weight gain due to medication 05/22/2011   Chronic rhinitis    ADHD (attention deficit hyperactivity disorder) 04/14/2011   Behavioral problems 04/14/2011   Past Surgical History:  Procedure Laterality Date   NO PAST SURGERIES      Home Medications    Prior to Admission medications   Medication Sig Start Date End Date Taking?  Authorizing Provider  ibuprofen (ADVIL) 800 MG tablet Take 1 tablet (800 mg total) by mouth every 8 (eight) hours as needed for moderate pain. 07/18/22   Hermanns, Bolling Bushy, PA-C    Family History Family History  Problem Relation Age of Onset   Hypertension Mother    Anemia Mother    Iron deficiency Mother    Diabetes Father    ADD / ADHD Brother    Social History Social History   Tobacco Use   Smoking status: Some Days    Types: Cigarettes    Passive exposure: Current   Smokeless tobacco: Never  Vaping Use   Vaping status: Every Day   Substances: Nicotine, Flavoring  Substance Use Topics   Alcohol use: Yes    Comment: occ   Drug use: Not Currently    Types: Marijuana   Allergies   Cinnamon  Review of Systems Review of Systems  Respiratory:  Positive for cough.    Pertinent findings revealed after performing a 14 point review of systems has been noted in the history of present illness.  Physical Exam Vital Signs BP (!) 147/71 (BP Location: Right Arm)   Pulse 94   Temp 99 F (37.2 C) (Oral)   Resp 18   SpO2 97%   No data found.  Physical Exam Constitutional:      General: He is awake. He is not in acute  distress.    Appearance: Normal appearance. He is well-developed and well-groomed. He is ill-appearing.  HENT:     Head: Normocephalic and atraumatic.     Salivary Glands: Right salivary gland is not diffusely enlarged or tender. Left salivary gland is not diffusely enlarged or tender.     Right Ear: Hearing and external ear normal. Tympanic membrane is erythematous and bulging.     Left Ear: Hearing and external ear normal. Tympanic membrane is erythematous and bulging.     Ears:     Comments: Bilateral EACs with erythema, bilateral TMs bulging with clear fluid    Nose: Congestion and rhinorrhea present. Rhinorrhea is clear.     Right Sinus: No maxillary sinus tenderness or frontal sinus tenderness.     Left Sinus: No maxillary sinus tenderness or frontal  sinus tenderness.     Mouth/Throat:     Lips: Pink.     Mouth: Mucous membranes are moist.     Pharynx: Uvula midline. Uvula swelling and postnasal drip present. No pharyngeal swelling, oropharyngeal exudate or posterior oropharyngeal erythema.     Tonsils: No tonsillar exudate. 0 on the right. 0 on the left.  Eyes:     General: Lids are normal.     Conjunctiva/sclera: Conjunctivae normal.     Right eye: Right conjunctiva is not injected.     Left eye: Left conjunctiva is not injected.     Pupils: Pupils are equal, round, and reactive to light.  Cardiovascular:     Rate and Rhythm: Normal rate and regular rhythm.     Pulses: Normal pulses.     Heart sounds: Normal heart sounds, S1 normal and S2 normal.  Pulmonary:     Effort: Pulmonary effort is normal. No tachypnea, bradypnea, accessory muscle usage, prolonged expiration or respiratory distress.     Breath sounds: Normal breath sounds and air entry. No stridor, decreased air movement or transmitted upper airway sounds. No decreased breath sounds, wheezing, rhonchi or rales.  Abdominal:     General: Abdomen is flat. Bowel sounds are normal.     Palpations: Abdomen is soft.  Musculoskeletal:        General: Normal range of motion.     Cervical back: Full passive range of motion without pain, normal range of motion and neck supple.  Lymphadenopathy:     Cervical: No cervical adenopathy.  Skin:    General: Skin is warm and dry.  Neurological:     General: No focal deficit present.     Mental Status: He is alert and oriented to person, place, and time.     Motor: Motor function is intact.     Coordination: Coordination is intact.     Gait: Gait is intact.     Deep Tendon Reflexes: Reflexes are normal and symmetric.  Psychiatric:        Attention and Perception: Attention and perception normal.        Mood and Affect: Mood and affect normal.        Speech: Speech normal.        Behavior: Behavior normal. Behavior is cooperative.         Thought Content: Thought content normal.     Visual Acuity Right Eye Distance:   Left Eye Distance:   Bilateral Distance:    Right Eye Near:   Left Eye Near:    Bilateral Near:     UC Couse / Diagnostics / Procedures:     Radiology No results found.  Procedures Procedures (including critical care time) EKG  Pending results:  Labs Reviewed  POC COVID19/FLU A&B COMBO - Abnormal; Notable for the following components:      Result Value   Covid Antigen, POC Positive (*)    All other components within normal limits    Medications Ordered in UC: Medications - No data to display  UC Diagnoses / Final Clinical Impressions(s)   I have reviewed the triage vital signs and the nursing notes.  Pertinent labs & imaging results that were available during my care of the patient were reviewed by me and considered in my medical decision making (see chart for details).    Final diagnoses:  COVID-19   Patient education provided to patient.  Antiviral treatment not indicated due to patient's age and lack of comorbidities.  I provided patient with Decadron 6 mg twice daily x 5 days for relief of bodyaches, cough, headache.  Guaifenesin recommended for daytime cough to promote expectoration and Promethazine DM recommended for nighttime cough so patient can get some sleep.  Conservative care recommended.  Return precautions advised.  Please see discharge instructions below for details of plan of care as provided to patient. ED Prescriptions     Medication Sig Dispense Auth. Provider   dexamethasone (DECADRON) 6 MG tablet Take 1 tablet (6 mg total) by mouth 2 (two) times daily with a meal for 5 days. 10 tablet Eloise Hake Scales, PA-C   guaifenesin (HUMIBID E) 400 MG TABS tablet Take 1 tablet 3 times daily as needed for chest congestion and cough 30 tablet Eloise Hake Scales, PA-C   promethazine-dextromethorphan (PROMETHAZINE-DM) 6.25-15 MG/5ML syrup Take 5 mLs by mouth at  bedtime as needed for cough. 60 mL Eloise Hake Scales, PA-C      PDMP not reviewed this encounter.  Pending results:  Labs Reviewed  POC COVID19/FLU A&B COMBO - Abnormal; Notable for the following components:      Result Value   Covid Antigen, POC Positive (*)    All other components within normal limits      Discharge Instructions      Your COVID-19 test is positive.  Because you do not have a history of being immune compromised, you are currently vaccinated for COVID-19, you are under the age of 16, and/or you do not have a risk of severe disease due to COVID-19, antiviral treatment is not indicated.    I have enclosed some information about COVID-19 and how to care for yourself at home as well as when to seek emergency medical attention.  Please remain at home and isolated from others for the next 24 hours.  After that, you can be around other people but will need to wear a mask when you are around other people for 5 more days.    I have provided you with a note to be out of work for 3 days.  If you need this note extended, please feel free to schedule an online appointment to discuss extending your time out of work until you feel completely better.   Please read below to learn more about the medications, dosages and frequencies that I recommend to help alleviate your symptoms and to get you feeling better soon:   Decadron (dexamethasone):  To quickly address the significant inflammation that COVID-19 causes, please begin dexamethasone 1 tablet twice daily for the next 5 days.  This will help with your cough, your body aches, headache, work of breathing and abdominal pain, if you are experiencing any at  this time.   Robitussin, Mucinex (guaifenesin): This is a daytime expectorant.  This single symptom reliever helps break up chest congestion and loosen up thick nasal drainage making phlegm and drainage easier to cough up and to blow out from your nose.  I recommend taking 400 mg  in either liquid or tablet form three times daily as needed.  I do not recommend the 12-hour extended relief version or doses higher than 400 mg per each dose as these often make some patients feel jittery or jumpy and can interfere with sleep.  I also do not recommend that you purchase guaifenesin with the ingredient " DM" which is dextromethorphan, a cough suppressant which I only recommend taking at bedtime.  Guaifenesin 400 mg is a safe dose for people who are being treated for high blood pressure.     Promethazine DM: Promethazine is both a nasal decongestant that dries up mucous membranes and an antinausea medication.  Promethazine often makes most patients feel fairly sleepy.  "DM" is dextromethorphan, a single symptom reliever which is a cough suppressant found in many over-the-counter cough medications and combination cold preparations.  Please take 5 mL before bedtime to minimize your cough which will help you sleep better.  I have sent a prescription for this medication to your pharmacy because it cannot be purchased over-the-counter.   Conservative care is also recommended with rest, drinking plenty of clear fluids, eating only when hungry, taking supportive medications for your symptoms and avoiding being around other people.  Please remain at home until you are fever free for 24 hours without the use of antifever medications such as Tylenol and ibuprofen.   If symptoms have not meaningfully improved in the next 5 to 7 days, please return for repeat evaluation or follow-up with your regular provider.  If symptoms have worsened in the next 3 to 5 days, please go to the emergency room for further evaluation.    Thank you for visiting urgent care today.  We appreciate the opportunity to participate in your care.    What is COVID-19?   ??V?D-19 stands for "coronavirus disease 2019." It is caused by a virus called ??RS-?oV-2.   C?V?D-19 mainly spreads from person to person usually when an  infected person coughs, sneezes, or talks near other people.  A person can become infected and spread the virus to others, even without having any symptoms.  This one of the reasons COVID-19 spreads so quickly.   Can COVID-19 be prevented?   Yes!  The best way to prevent ??V?D-19 is to get vaccinated. In the [place] , people age 48 months and older can get a vaccine.  In addition to vaccination, there are other things you can do to help protect yourself and others. You can:  ?Wash your hands often. ?Consider wearing a face mask in some situations. Masks can help protect both the wearer and others around them. ?Stay home when you are sick. Try to avoid close contact with other people. ?Cover your mouth and nose with the inside of your elbow when you cough or sneeze. ?If someone in your home is sick, regularly clean things that are touched a lot. This includes counters, bedside tables, doorknobs, computers, phones, and bathroom surfaces. ?Make sure that there is good ventilation (air flow) in your home. When possible, open windows to let fresh air in.  Experts recommend "layering" these strategies. This means doing more than 1 of the things above to protect yourself, especially at times when lots of  people are sick.  What are the symptoms of COVID-19?   Symptoms usually start 3 to 5 days after a person is infected with the virus but for some people, it can take up to 2 weeks for symptoms to appear. Many infected people only have mild cold symptoms. Some people never show symptoms at all.  When symptoms do happen, they can include:  ? Fever ? Cough ? Trouble breathing ? Feeling tired ? Shaking chills ? Muscle aches ? Headache ? Sore throat ? Runny or stuffy nose ? Problems with sense of smell or taste ? Diarrhea and vomiting ? Rash or other skin problems  For most people, symptoms get better within a few days to weeks. But a small number of people get very sick and stop being able to  breathe on their own. In severe cases, their organs stop working, which can lead to death.  Some people with C?VID-19 continue to have some symptoms for weeks or months. This seems to be more likely in people who are sick enough to need to stay in the hospital. But this can also happen in people who did not get very sick.  What should I do if I get COVID-19?   Stay home, rest, and drink plenty of fluids. You can also take acetaminophen (sample brand name: Tylenol) to relieve fever and aches. If this does not help, you can try medicines like ibuprofen (sample brand names: Advil, Motrin).  If you go to a walk-in clinic or a hospital because of your symptoms, tell someone right away why you are there. The staff might ask you to wear a mask or to wait someplace where you are less likely to spread your infection.  Whether or not you need to see a medical provider, stay home while you are sick with C?V?D-19. Do not go to work or school until your fever has been gone for at least 24 hours without taking medicine such as acetaminophen.  If your breathing symptoms get worse, call your doctor or nurse for advice. If you think that you are having a medical emergency, call 911 for an ambulance  If I have COVID-19, do I need special treatment?   It depends on your age, health, and symptoms. Most people with mild C?VID-19 can rest at home until they get better. "Mild" means that you might have symptoms like fever, cough, or other cold symptoms, but you do not have trouble breathing. Treatment is not recommended. It often takes about 2 weeks for symptoms to improve, but it's not the same for everyone.  Doctors do recommend treatment for people who are at risk for getting seriously ill, even if their symptoms are mild. This includes:  ?Adults 65 years or older ?Adults who have certain health conditions - Examples include a weaker than normal immune system, diabetes, serious heart or lung disease, chronic kidney  disease, and obesity. ?Adults 50 years or older who have not been vaccinated  If you are not sure if you fit into any of these categories, ask your doctor or nurse about treatment. They can talk to you about the risks and benefits.  How is COVID-19 treated?   The medicine most often used is an "antiviral" called nirmatrelvir-ritonavir (brand name: Paxlovid). This can lower your risk of getting sicker.  If your doctor suggests this treatment, it's important to know:  ?Paxlovid comes as several pills that you take for 5 days. ?Treatment should be started within 5 days after symptoms begin. This  is why it's important to test early so you know if you have C?V?D-19 as soon as possible. ?Before prescribing Paxlovid, your medical provider will review any other medicines and supplements that you take. In some cases, they might want to change or stop your other medicines while you take Paxlovid.  Some people who are treated with Paxlovid get something called "viral rebound." This means that they start testing negative after having C?V?D-19, but then test positive again. Symptoms might also come back, though they are almost always mild. If you are at risk for serious illness, the benefits of treatment are still greater than the risk of viral rebound.  For people who cannot take Paxlovid, there are other options for treatment that you may discuss with your medical provider.  Am I at risk for getting seriously ill?   It depends on your age, your health, and whether you have been vaccinated. In some people, ??V?D-19 leads to serious problems like ???um?ni?, which can cause a person to not get enough oxygen. It can also lead to heart problems, or even death. This risk gets higher as people get older. It is also higher in people who have other health problems like serious heart disease, chronic kidney disease, type 2 diabetes, chronic obstructive pulmonary disease ("COPD"), sickle cell disease, or obesity.  People who have a weak immune system for other reasons (for example, HIV infection or certain medicines), asthma, cystic fibrosis, type 1 diabetes, or high blood pressure might also be at higher risk for serious problems.  Getting vaccinated makes people much less likely to get seriously ill with ??V?D-19.  When should I seek medical attention?   Call your doctor if:  ?You develop new shortness of breath, or your breathing gets worse (but you can still talk in full sentences). ?You become weak or dizzy. ?You have very dark urine or do not urinate for more than 8 hours. ?You have new or worsening symptoms that concern you - ??VID-19 symptoms can include fever, cough, feeling very tired, shaking, chills, headache, and trouble swallowing. They can also include digestive problems like vomiting or diarrhea.  Call 911 for an ambulance if:  ?You are having so much trouble breathing that you cannot speak a full sentence. ?You are very confused or cannot stay awake. ?Your lips or skin start to turn blue. ?You think that you might be having a medical emergency - Examples include severe chest pain, feeling extremely weak or like you might pass out, or losing control of your body (like being unable to speak normally or move your arm or leg).  Where can I go to learn more?   You can find more information about ?OVID-19 at the following websites: ?[place]  Centers for Disease Control and Prevention ("CDC"): IndexCrawler.co.za ?World Health Organization ("WHO"): AffordableSalon.es      Disposition Upon Discharge:  Condition: stable for discharge home  Patient presented with an acute illness with associated systemic symptoms and significant discomfort requiring urgent management. In my opinion, this is a condition that a prudent lay person (someone who possesses an average knowledge of health and medicine) may potentially expect to result in complications if not  addressed urgently such as respiratory distress, impairment of bodily function or dysfunction of bodily organs.   Routine symptom specific, illness specific and/or disease specific instructions were discussed with the patient and/or caregiver at length.   As such, the patient has been evaluated and assessed, work-up was performed and treatment was provided in alignment with urgent care  protocols and evidence based medicine.  Patient/parent/caregiver has been advised that the patient may require follow up for further testing and treatment if the symptoms continue in spite of treatment, as clinically indicated and appropriate.  Patient/parent/caregiver has been advised to return to the [facility] or PCP if no better; to PCP or the Emergency Department if new signs and symptoms develop, or if the current signs or symptoms continue to change or worsen for further workup, evaluation and treatment as clinically indicated and appropriate  The patient will follow up with their current PCP if and as advised. If the patient does not currently have a PCP we will assist them in obtaining one.   The patient may need specialty follow up if the symptoms continue, in spite of conservative treatment and management, for further workup, evaluation, consultation and treatment as clinically indicated and appropriate.  Patient/parent/caregiver verbalized understanding and agreement of plan as discussed.  All questions were addressed during visit.  Please see discharge instructions below for further details of plan.  This office note has been dictated using Teaching laboratory technician.  Unfortunately, this method of dictation can sometimes lead to typographical or grammatical errors.  I apologize for your inconvenience in advance if this occurs.  Please do not hesitate to reach out to me if clarification is needed.      Eloise Hake Scales, PA-C 07/12/23 1056
